# Patient Record
Sex: Male | Born: 2000
Health system: Southern US, Community
[De-identification: ages and names within clinical notes are randomized; demographics above are authoritative.]

## PROBLEM LIST (undated history)

## (undated) DIAGNOSIS — D649 Anemia, unspecified: Secondary | ICD-10-CM

## (undated) DIAGNOSIS — J309 Allergic rhinitis, unspecified: Secondary | ICD-10-CM

## (undated) DIAGNOSIS — R112 Nausea with vomiting, unspecified: Secondary | ICD-10-CM

## (undated) DIAGNOSIS — Z862 Personal history of diseases of the blood and blood-forming organs and certain disorders involving the immune mechanism: Secondary | ICD-10-CM

## (undated) DIAGNOSIS — Z9889 Other specified postprocedural states: Secondary | ICD-10-CM

## (undated) DIAGNOSIS — R42 Dizziness and giddiness: Secondary | ICD-10-CM

## (undated) DIAGNOSIS — E611 Iron deficiency: Secondary | ICD-10-CM

## (undated) HISTORY — DX: Dizziness and giddiness: R42

## (undated) HISTORY — DX: Allergic rhinitis, unspecified: J30.9

## (undated) HISTORY — PX: ESOPHAGOGASTRODUODENOSCOPY: SHX1529

## (undated) HISTORY — PX: INNER EAR SURGERY: SHX679

## (undated) HISTORY — DX: Anemia, unspecified: D64.9

## (undated) HISTORY — DX: Iron deficiency: E61.1

---
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
## 2022-03-12 ENCOUNTER — Telehealth: Payer: Self-pay | Admitting: Cardiology

## 2022-03-12 NOTE — Telephone Encounter (Signed)
Patient states he received a call to schedule an appointment with Dr. Radford Pax. I did not see a referral and he states his mother spoke with Dr. Radford Pax and it was approved for him to schedule.

## 2022-03-13 ENCOUNTER — Telehealth: Payer: Self-pay

## 2022-03-13 NOTE — Telephone Encounter (Signed)
-----   Message from Sueanne Margarita, MD sent at 03/13/2022  9:35 AM EST ----- Regarding: RE: Appointment for [name]? Afternoon is fine ----- Message ----- From: Joni Reining, RN Sent: 03/13/2022   9:11 AM EST To: Sueanne Margarita, MD; Tor Netters, RN Subject: RE: Appointment for [name]?               PCP contacted. Unfortunately, the only time he could come in on Tuesday 03/17/22 is the afternoon. Is there something else you want me to try?  Danae Chen ----- Message ----- From: Sueanne Margarita, MD Sent: 03/13/2022   8:49 AM EST To: Joni Reining, RN; Tor Netters, RN Subject: RE: Appointment for [name]?               So I'm Reader D on Tuesday next week so just put him in anytime in the morning and I can see him.  Also yes contact his PCP for referral  Thank you! ----- Message ----- From: Joni Reining, RN Sent: 03/13/2022   8:05 AM EST To: Sueanne Margarita, MD; Tor Netters, RN Subject: RE: Appointment for [name]?               Mina Marble it. Someone got the 1:40 pm appt on 03/18/22 before I did. Is there another time next week that works?  Also, do you need me to contact his PCP and have them put in a referral? Danae Chen ----- Message ----- From: Sueanne Margarita, MD Sent: 03/11/2022   8:46 PM EST To: Joni Reining, RN Subject: RE: Appointment for [name]?               Yes that works great ----- Message ----- From: Joni Reining, RN Sent: 03/11/2022   3:52 PM EST To: Sueanne Margarita, MD Subject: Appointment for [name]?                   Hi Dr. Radford Pax, A patient of  yours named Dace Denn (MRN [id number]) called in today to say that she spoke with you about having her son Taishaun seen next week before he goes back to classes at [employer]. If that is true it looks like you have an opening on 03/18/12 at 2:40 pm if you would like to put him there.  Danae Chen, RN

## 2022-03-13 NOTE — Telephone Encounter (Signed)
Called and left message for patient to call us back regarding scheduling.

## 2022-03-13 NOTE — Telephone Encounter (Signed)
-----   Message from Sueanne Margarita, MD sent at 03/11/2022  8:46 PM EST ----- Regarding: RE: Appointment for [name]? Yes that works great ----- Message ----- From: Joni Reining, RN Sent: 03/11/2022   3:52 PM EST To: Sueanne Margarita, MD Subject: Appointment for [name]?                   Hi Dr. Radford Pax, A patient of  yours named Ryan Knight (MRN [id number]) called in today to say that she spoke with you about having her son [name] seen next week before he goes back to classes at [employer]. If that is true it looks like you have an opening on 03/18/12 at 2:40 pm if you would like to put him there.  Danae Chen, RN

## 2022-03-13 NOTE — Telephone Encounter (Signed)
See above phone note.  

## 2022-03-13 NOTE — Telephone Encounter (Signed)
Patient scheduled 03/17/22 at 2 pm with Dr. Radford Pax for lightheadedness. Requesting referral. Please call [facility] at [phone] if any questions.

## 2022-03-13 NOTE — Telephone Encounter (Signed)
Scheduled patient for 03/17/22 at 2 pm, left message with this appointment information on patient's voice mail asking that patient call back and confirm.

## 2022-03-16 NOTE — Telephone Encounter (Signed)
Attempted phone call to pt as we do not have a DPR on file.  Left voicemail message to contact the office at [phone].

## 2022-03-16 NOTE — Telephone Encounter (Signed)
Patient's mother is following up requesting to speak with Dr. Theodosia Blender nurse regarding 1/09 appointment. She states she spoke with Dr. Radford Pax and she advised that it may not be necessary for the patient to be seen by her--he may just need a heart monitor. Patient's mother would like to confirm whether or not appointment is needed prior to cancelling. She would also like to know what type of monitor may be needed for the patient. Please advise.

## 2022-03-16 NOTE — Telephone Encounter (Signed)
Pt called back. He stated he tried to get a referral form PCP was was unsuccessful. My team lead, Edwena Blow tried to call Dr. Joylene Draft office however they turned their phones off at 4:00pm due to high call volume. Edwena Blow said she will try to c/b tomorrow morning for an urgent referral.

## 2022-03-17 ENCOUNTER — Ambulatory Visit: Payer: Self-pay | Admitting: Cardiology

## 2022-03-24 NOTE — H&P (Signed)
  Patient's anticipated LOS is less than 2 midnights, meeting these requirements: - Younger than 31 - Lives within 1 hour of care - Has a competent adult at home to recover with post-op recover - NO history of  - Chronic pain requiring opiods  - Diabetes  - Coronary Artery Disease  - Heart failure  - Heart attack  - Stroke  - DVT/VTE  - Cardiac arrhythmia  - Respiratory Failure/COPD  - Renal failure  - Anemia  - Advanced Liver disease     Ryan Knight is an 22 y.o. male.    Chief Complaint: right shoulder pain  HPI: Pt is a 22 y.o. male complaining of right shoulder pain since recent fall. Pain had continually increased since the beginning. X-rays in the clinic show displaced right clavicle fracture.Various options are discussed with the patient. Risks, benefits and expectations were discussed with the patient. Patient understand the risks, benefits and expectations and wishes to proceed with surgery.   PCP:  Crist Infante, MD  D/C Plans: Home  PMH: Past Medical History:  Diagnosis Date   Allergic rhinitis    Anemia     PSH: Past Surgical History:  Procedure Laterality Date   ESOPHAGOGASTRODUODENOSCOPY     INNER EAR SURGERY Right     Social History:  reports that he has never smoked. He has never used smokeless tobacco. He reports that he does not drink alcohol and does not use drugs. BMI: Estimated body mass index is 20.03 kg/m as calculated from the following:   Height as of 02/08/19: 6\' 2"  (1.88 m).   Weight as of 02/08/19: 70.8 kg.    Diabetes: Patient does not have a diagnosis of diabetes.     Smoking Status:   reports that he has never smoked. He has never used smokeless tobacco.    Allergies:  Allergies  Allergen Reactions   Penicillins     Medications: No current facility-administered medications for this encounter.   Current Outpatient Medications  Medication Sig Dispense Refill   MULTIPLE VITAMIN PO Take 1 tablet by mouth daily.       No results found for this or any previous visit (from the past 48 hour(s)). No results found.  ROS: Pain with rom of the right upper extremity  Physical Exam: Alert and oriented 22 y.o. male in no acute distress Cranial nerves 2-12 intact Cervical spine: full rom with no tenderness, nv intact distally Chest: active breath sounds bilaterally, no wheeze rhonchi or rales Heart: regular rate and rhythm, no murmur Abd: non tender non distended with active bowel sounds Hip is stable with rom  Right shoulder with obvious deformity of midshaft clavicle with tenting of the skin Nv intact distally No signs of open injury   Assessment/Plan Assessment: right clavicle fracture  Plan:  Patient will undergo a right clavicle ORIF by Dr. Veverly Fells at [place] Risks benefits and expectations were discussed with the patient. Patient understand risks, benefits and expectations and wishes to proceed. Preoperative templating of the joint replacement has been completed, documented, and submitted to the Operating Room personnel in order to optimize intra-operative equipment management.   Merla Riches PA-C, MPAS [facility] is now [employer] [address], [place], [place], [place] [address] Phone: [phone] [email or web address] Facebook  [employer]

## 2022-03-25 ENCOUNTER — Encounter (HOSPITAL_COMMUNITY): Payer: Self-pay | Admitting: Orthopedic Surgery

## 2022-03-25 ENCOUNTER — Other Ambulatory Visit: Payer: Self-pay

## 2022-03-25 NOTE — Progress Notes (Addendum)
For Anesthesia: PCP - Crist Infante, MD  Cardiologist - N/A  Chest x-ray - N/A EKG - Dr. Silvestre Mesi office Stress Test - N/A ECHO - N/A Cardiac Cath - N/A Pacemaker/ICD device last checked: N/A Pacemaker orders received: N/A Device Rep notified: N/A  Spinal Cord Stimulator: N/A  Sleep Study - N/A CPAP -   Fasting Blood Sugar - N/A Checks Blood Sugar __N/A___ times a day Date and result of last Hgb A1c-N/A  Last dose of GLP1 agonist- N/A GLP1 instructions: N/A  Last dose of SGLT-2 inhibitors- N/A SGLT-2 instructions: N/A  Blood Thinner Instructions: N/A Aspirin Instructions: N/A Last Dose: N/A  Activity level: Able to exercise without chest pain and/or shortness of breath     Anesthesia review: N/A  Patient denies shortness of breath, fever, cough and chest pain at PAT appointment   Patient verbalized understanding of instructions reviewed via telephone.

## 2022-03-25 NOTE — Progress Notes (Signed)
Attempted to obtain medical history via telephone, unable to reach at this time. HIPAA compliant voicemail message left requesting return call to pre surgical testing department.  

## 2022-03-27 ENCOUNTER — Ambulatory Visit (HOSPITAL_COMMUNITY): Payer: 59 | Admitting: Anesthesiology

## 2022-03-27 ENCOUNTER — Other Ambulatory Visit: Payer: Self-pay

## 2022-03-27 ENCOUNTER — Encounter (HOSPITAL_COMMUNITY): Payer: Self-pay | Admitting: Orthopedic Surgery

## 2022-03-27 ENCOUNTER — Encounter (HOSPITAL_COMMUNITY)
Admission: RE | Disposition: A | Discharge status: Home / Self Care | Payer: Self-pay | Source: Home / Self Care | Attending: Orthopedic Surgery

## 2022-03-27 ENCOUNTER — Ambulatory Visit (HOSPITAL_BASED_OUTPATIENT_CLINIC_OR_DEPARTMENT_OTHER): Payer: 59 | Admitting: Anesthesiology

## 2022-03-27 ENCOUNTER — Ambulatory Visit (HOSPITAL_COMMUNITY)
Admission: RE | Admit: 2022-03-27 | Discharge: 2022-03-27 | Disposition: A | Discharge status: Home / Self Care | Payer: 59 | Attending: Orthopedic Surgery | Admitting: Orthopedic Surgery

## 2022-03-27 ENCOUNTER — Ambulatory Visit (HOSPITAL_COMMUNITY): Payer: 59

## 2022-03-27 DIAGNOSIS — D649 Anemia, unspecified: Secondary | ICD-10-CM

## 2022-03-27 DIAGNOSIS — Y9323 Activity, snow (alpine) (downhill) skiing, snow boarding, sledding, tobogganing and snow tubing: Secondary | ICD-10-CM | POA: Diagnosis not present

## 2022-03-27 DIAGNOSIS — S42021A Displaced fracture of shaft of right clavicle, initial encounter for closed fracture: Secondary | ICD-10-CM

## 2022-03-27 HISTORY — DX: Personal history of diseases of the blood and blood-forming organs and certain disorders involving the immune mechanism: Z86.2

## 2022-03-27 HISTORY — DX: Nausea with vomiting, unspecified: R11.2

## 2022-03-27 HISTORY — DX: Other specified postprocedural states: Z98.890

## 2022-03-27 HISTORY — PX: ORIF CLAVICULAR FRACTURE: SHX5055

## 2022-03-27 LAB — CBC
HCT: 46 % (ref 39.0–52.0)
Hemoglobin: 14.7 g/dL (ref 13.0–17.0)
MCH: 25 pg — ABNORMAL LOW (ref 26.0–34.0)
MCHC: 32 g/dL (ref 30.0–36.0)
MCV: 78.2 fL — ABNORMAL LOW (ref 80.0–100.0)
Platelets: 243 10*3/uL (ref 150–400)
RBC: 5.88 MIL/uL — ABNORMAL HIGH (ref 4.22–5.81)
RDW: 13.7 % (ref 11.5–15.5)
WBC: 5.5 10*3/uL (ref 4.0–10.5)
nRBC: 0 % (ref 0.0–0.2)

## 2022-03-27 SURGERY — OPEN REDUCTION INTERNAL FIXATION (ORIF) CLAVICULAR FRACTURE
Anesthesia: General | Laterality: Right

## 2022-03-27 MED ORDER — ACETAMINOPHEN 500 MG PO TABS
1000.0000 mg | ORAL_TABLET | Freq: Once | ORAL | Status: AC
  Administered 2022-03-27: 1000 mg via ORAL
  Filled 2022-03-27: qty 2

## 2022-03-27 MED ORDER — PROPOFOL 10 MG/ML IV BOLUS
INTRAVENOUS | Status: DC | PRN
  Administered 2022-03-27: 200 mg via INTRAVENOUS

## 2022-03-27 MED ORDER — BUPIVACAINE-EPINEPHRINE 0.25% -1:200000 IJ SOLN
INTRAMUSCULAR | Status: DC | PRN
  Administered 2022-03-27: 30 mL

## 2022-03-27 MED ORDER — FENTANYL CITRATE (PF) 250 MCG/5ML IJ SOLN
INTRAMUSCULAR | Status: AC
  Filled 2022-03-27: qty 5

## 2022-03-27 MED ORDER — SUGAMMADEX SODIUM 200 MG/2ML IV SOLN
INTRAVENOUS | Status: DC | PRN
  Administered 2022-03-27: 300 mg via INTRAVENOUS

## 2022-03-27 MED ORDER — PROPOFOL 10 MG/ML IV BOLUS
INTRAVENOUS | Status: AC
  Filled 2022-03-27: qty 20

## 2022-03-27 MED ORDER — LIDOCAINE HCL (CARDIAC) PF 100 MG/5ML IV SOSY
PREFILLED_SYRINGE | INTRAVENOUS | Status: DC | PRN
  Administered 2022-03-27: 80 mg via INTRATRACHEAL

## 2022-03-27 MED ORDER — PHENYLEPHRINE HCL-NACL 20-0.9 MG/250ML-% IV SOLN
INTRAVENOUS | Status: DC | PRN
  Administered 2022-03-27: 25 ug/min via INTRAVENOUS

## 2022-03-27 MED ORDER — CHLORHEXIDINE GLUCONATE 0.12 % MT SOLN
15.0000 mL | Freq: Once | OROMUCOSAL | Status: AC
  Administered 2022-03-27: 15 mL via OROMUCOSAL

## 2022-03-27 MED ORDER — ROCURONIUM BROMIDE 100 MG/10ML IV SOLN
INTRAVENOUS | Status: DC | PRN
  Administered 2022-03-27: 10 mg via INTRAVENOUS
  Administered 2022-03-27: 50 mg via INTRAVENOUS

## 2022-03-27 MED ORDER — ORAL CARE MOUTH RINSE: 15.0000 mL | Freq: Once | OROMUCOSAL | Status: AC

## 2022-03-27 MED ORDER — FENTANYL CITRATE PF 50 MCG/ML IJ SOSY
PREFILLED_SYRINGE | INTRAMUSCULAR | Status: AC
  Filled 2022-03-27: qty 2

## 2022-03-27 MED ORDER — ONDANSETRON HCL 4 MG/2ML IJ SOLN
INTRAMUSCULAR | Status: DC | PRN
  Administered 2022-03-27: 4 mg via INTRAVENOUS

## 2022-03-27 MED ORDER — OXYCODONE HCL 5 MG PO TABS: 5.0000 mg | ORAL_TABLET | Freq: Once | ORAL | Status: DC | PRN

## 2022-03-27 MED ORDER — METHOCARBAMOL 500 MG PO TABS: 500.0000 mg | ORAL_TABLET | Freq: Three times a day (TID) | ORAL | 1 refills | Status: AC | PRN

## 2022-03-27 MED ORDER — LACTATED RINGERS IV SOLN: INTRAVENOUS | Status: DC

## 2022-03-27 MED ORDER — DEXAMETHASONE SODIUM PHOSPHATE 10 MG/ML IJ SOLN
INTRAMUSCULAR | Status: DC | PRN
  Administered 2022-03-27: 10 mg via INTRAVENOUS

## 2022-03-27 MED ORDER — KETOROLAC TROMETHAMINE 30 MG/ML IJ SOLN
INTRAMUSCULAR | Status: DC | PRN
  Administered 2022-03-27: 30 mg via INTRAVENOUS

## 2022-03-27 MED ORDER — OXYCODONE HCL 5 MG/5ML PO SOLN: 5.0000 mg | Freq: Once | ORAL | Status: DC | PRN

## 2022-03-27 MED ORDER — EPINEPHRINE PF 1 MG/ML IJ SOLN
INTRAMUSCULAR | Status: AC
  Filled 2022-03-27: qty 1

## 2022-03-27 MED ORDER — MIDAZOLAM HCL 2 MG/2ML IJ SOLN
INTRAMUSCULAR | Status: AC
  Filled 2022-03-27: qty 2

## 2022-03-27 MED ORDER — ROCURONIUM BROMIDE 10 MG/ML (PF) SYRINGE
PREFILLED_SYRINGE | INTRAVENOUS | Status: AC
  Filled 2022-03-27: qty 10

## 2022-03-27 MED ORDER — CELECOXIB 200 MG PO CAPS
200.0000 mg | ORAL_CAPSULE | Freq: Once | ORAL | Status: AC
  Administered 2022-03-27: 200 mg via ORAL
  Filled 2022-03-27: qty 1

## 2022-03-27 MED ORDER — PROMETHAZINE HCL 25 MG/ML IJ SOLN: 6.2500 mg | INTRAMUSCULAR | Status: DC | PRN

## 2022-03-27 MED ORDER — ONDANSETRON HCL 4 MG PO TABS: 4.0000 mg | ORAL_TABLET | Freq: Three times a day (TID) | ORAL | 1 refills | Status: AC | PRN

## 2022-03-27 MED ORDER — EPHEDRINE 5 MG/ML INJ
INTRAVENOUS | Status: AC
  Filled 2022-03-27: qty 15

## 2022-03-27 MED ORDER — DEXAMETHASONE SODIUM PHOSPHATE 10 MG/ML IJ SOLN
INTRAMUSCULAR | Status: AC
  Filled 2022-03-27: qty 3

## 2022-03-27 MED ORDER — AMISULPRIDE (ANTIEMETIC) 5 MG/2ML IV SOLN: 10.0000 mg | Freq: Once | INTRAVENOUS | Status: DC | PRN

## 2022-03-27 MED ORDER — ONDANSETRON HCL 4 MG/2ML IJ SOLN
INTRAMUSCULAR | Status: AC
  Filled 2022-03-27: qty 6

## 2022-03-27 MED ORDER — MEPERIDINE HCL 50 MG/ML IJ SOLN: 6.2500 mg | INTRAMUSCULAR | Status: DC | PRN

## 2022-03-27 MED ORDER — HYDROCODONE-ACETAMINOPHEN 5-325 MG PO TABS: 1.0000 | ORAL_TABLET | Freq: Four times a day (QID) | ORAL | 0 refills | Status: AC | PRN

## 2022-03-27 MED ORDER — EPHEDRINE 5 MG/ML INJ
INTRAVENOUS | Status: AC
  Filled 2022-03-27: qty 5

## 2022-03-27 MED ORDER — LIDOCAINE HCL (PF) 2 % IJ SOLN
INTRAMUSCULAR | Status: AC
  Filled 2022-03-27: qty 30

## 2022-03-27 MED ORDER — SUCCINYLCHOLINE CHLORIDE 200 MG/10ML IV SOSY
PREFILLED_SYRINGE | INTRAVENOUS | Status: AC
  Filled 2022-03-27: qty 30

## 2022-03-27 MED ORDER — CEFAZOLIN SODIUM-DEXTROSE 2-4 GM/100ML-% IV SOLN
2.0000 g | INTRAVENOUS | Status: AC
  Administered 2022-03-27: 2 g via INTRAVENOUS
  Filled 2022-03-27: qty 100

## 2022-03-27 MED ORDER — BUPIVACAINE HCL (PF) 0.25 % IJ SOLN
INTRAMUSCULAR | Status: AC
  Filled 2022-03-27: qty 30

## 2022-03-27 MED ORDER — MIDAZOLAM HCL 2 MG/2ML IJ SOLN
INTRAMUSCULAR | Status: DC | PRN
  Administered 2022-03-27: 2 mg via INTRAVENOUS

## 2022-03-27 MED ORDER — FENTANYL CITRATE (PF) 250 MCG/5ML IJ SOLN
INTRAMUSCULAR | Status: DC | PRN
  Administered 2022-03-27: 100 ug via INTRAVENOUS
  Administered 2022-03-27: 50 ug via INTRAVENOUS
  Administered 2022-03-27 (×2): 100 ug via INTRAVENOUS

## 2022-03-27 MED ORDER — 0.9 % SODIUM CHLORIDE (POUR BTL) OPTIME
TOPICAL | Status: DC | PRN
  Administered 2022-03-27: 1000 mL

## 2022-03-27 MED ORDER — HYDROMORPHONE HCL 1 MG/ML IJ SOLN: 0.2500 mg | INTRAMUSCULAR | Status: DC | PRN

## 2022-03-27 MED ORDER — PROPOFOL 500 MG/50ML IV EMUL
INTRAVENOUS | Status: AC
  Filled 2022-03-27: qty 50

## 2022-03-27 SURGICAL SUPPLY — 47 items
BAG COUNTER SPONGE SURGICOUNT (BAG) IMPLANT
BAG ZIPLOCK 12X15 (MISCELLANEOUS) ×1 IMPLANT
BIT DRILL TWSTPONT CRW 3.2X180 (DRILL) IMPLANT
BOOTIES KNEE HIGH SLOAN (MISCELLANEOUS) ×2 IMPLANT
COVER SURGICAL LIGHT HANDLE (MISCELLANEOUS) ×1 IMPLANT
DRAPE ORTHO SPLIT 77X108 STRL (DRAPES) ×2
DRAPE POUCH INSTRU U-SHP 10X18 (DRAPES) ×1 IMPLANT
DRAPE SURG 17X11 SM STRL (DRAPES) ×1 IMPLANT
DRAPE SURG ORHT 6 SPLT 77X108 (DRAPES) ×2 IMPLANT
DRAPE U-SHAPE 47X51 STRL (DRAPES) ×1 IMPLANT
DRILL TWISTPOINT CROWE 3.2X180 (DRILL) ×1
DRSG EMULSION OIL 3X16 NADH (GAUZE/BANDAGES/DRESSINGS) IMPLANT
DRSG EMULSION OIL 3X3 NADH (GAUZE/BANDAGES/DRESSINGS) ×1 IMPLANT
DURAPREP 26ML APPLICATOR (WOUND CARE) ×1 IMPLANT
ELECT NDL TIP 2.8 STRL (NEEDLE) ×1 IMPLANT
ELECT NEEDLE TIP 2.8 STRL (NEEDLE) ×1 IMPLANT
ELECT REM PT RETURN 15FT ADLT (MISCELLANEOUS) ×1 IMPLANT
GAUZE PAD ABD 7.5X8 STRL (GAUZE/BANDAGES/DRESSINGS) IMPLANT
GAUZE PAD ABD 8X10 STRL (GAUZE/BANDAGES/DRESSINGS) ×1 IMPLANT
GAUZE SPONGE 4X4 12PLY STRL (GAUZE/BANDAGES/DRESSINGS) ×1 IMPLANT
GLOVE BIOGEL PI IND STRL 7.5 (GLOVE) ×1 IMPLANT
GLOVE BIOGEL PI IND STRL 8.5 (GLOVE) ×1 IMPLANT
GLOVE ORTHO TXT STRL SZ7.5 (GLOVE) ×1 IMPLANT
GLOVE SURG ORTHO 8.5 STRL (GLOVE) ×1 IMPLANT
GOWN STRL REUS W/ TWL XL LVL3 (GOWN DISPOSABLE) ×2 IMPLANT
GOWN STRL REUS W/TWL XL LVL3 (GOWN DISPOSABLE) ×2
KIT BASIN OR (CUSTOM PROCEDURE TRAY) ×1 IMPLANT
KIT TURNOVER KIT A (KITS) IMPLANT
MANIFOLD NEPTUNE II (INSTRUMENTS) ×1 IMPLANT
NDL SAFETY ECLIP 18X1.5 (MISCELLANEOUS) ×1 IMPLANT
NS IRRIG 1000ML POUR BTL (IV SOLUTION) ×1 IMPLANT
PACK SHOULDER (CUSTOM PROCEDURE TRAY) ×1 IMPLANT
PIN CLAVICLE ASSEMBLY 3.0MM (Pin) IMPLANT
PROTECTOR NERVE ULNAR (MISCELLANEOUS) ×1 IMPLANT
SLING ARM IMMOBILIZER LRG (SOFTGOODS) ×1 IMPLANT
SPIKE FLUID TRANSFER (MISCELLANEOUS) ×1 IMPLANT
SPONGE T-LAP 4X18 ~~LOC~~+RFID (SPONGE) IMPLANT
STRIP CLOSURE SKIN 1/2X4 (GAUZE/BANDAGES/DRESSINGS) ×1 IMPLANT
SUCTION FRAZIER HANDLE 12FR (TUBING) ×1
SUCTION TUBE FRAZIER 12FR DISP (TUBING) ×1 IMPLANT
SUT FIBERWIRE #2 38 T-5 BLUE (SUTURE)
SUT MNCRL AB 3-0 PS2 18 (SUTURE) ×1 IMPLANT
SUT VIC AB 0 CT1 36 (SUTURE) ×1 IMPLANT
SUT VIC AB 2-0 CT1 27 (SUTURE) ×1
SUT VIC AB 2-0 CT1 TAPERPNT 27 (SUTURE) ×1 IMPLANT
SUTURE FIBERWR #2 38 T-5 BLUE (SUTURE) IMPLANT
TOWEL OR 17X26 10 PK STRL BLUE (TOWEL DISPOSABLE) ×1 IMPLANT

## 2022-03-27 NOTE — Brief Op Note (Signed)
03/27/2022  5:30 PM  PATIENT:  Ryan Knight  22 y.o. male  PRE-OPERATIVE DIAGNOSIS:  Fracture of clavicle, displaced midshaft  POST-OPERATIVE DIAGNOSIS:  Fracture of clavicle, displaced midshaft  PROCEDURE:  Procedure(s): OPEN REDUCTION INTERNAL FIXATION (ORIF) CLAVICULAR FRACTURE (Right) Biomet IM nail  SURGEON:  Surgeon(s) and Role:    Netta Cedars, MD - Primary  PHYSICIAN ASSISTANT:   ASSISTANTS: Ventura Bruns, PA-C   ANESTHESIA:   local and general  EBL:  minimal  BLOOD ADMINISTERED:none  DRAINS: none   LOCAL MEDICATIONS USED:  MARCAINE     SPECIMEN:  No Specimen  DISPOSITION OF SPECIMEN:  N/A  COUNTS:  YES  TOURNIQUET:  * No tourniquets in log *  DICTATION: .Other Dictation: Dictation Number [id number]  PLAN OF CARE: Discharge to home after PACU  PATIENT DISPOSITION:  PACU - hemodynamically stable.   Delay start of Pharmacological VTE agent (>24hrs) due to surgical blood loss or risk of bleeding: yes

## 2022-03-27 NOTE — Anesthesia Postprocedure Evaluation (Signed)
Anesthesia Post Note  Patient: Ryan Knight  Procedure(s) Performed: OPEN REDUCTION INTERNAL FIXATION (ORIF) CLAVICULAR FRACTURE (Right)     Patient location during evaluation: PACU Anesthesia Type: General Level of consciousness: sedated and patient cooperative Pain management: pain level controlled Vital Signs Assessment: post-procedure vital signs reviewed and stable Respiratory status: spontaneous breathing Cardiovascular status: stable Anesthetic complications: no   No notable events documented.  Last Vitals:  Vitals:   03/27/22 1737 03/27/22 1815  BP: 139/75 130/80  Pulse: 76 70  Resp: 16 14  Temp: 37.7 C 37.2 C  SpO2: 100% 99%    Last Pain:  Vitals:   03/27/22 1815  TempSrc:   PainSc: 0-No pain                 Nolon Nations

## 2022-03-27 NOTE — Anesthesia Procedure Notes (Signed)
Procedure Name: Intubation Date/Time: 03/27/2022 4:18 PM  Performed by: Cynda Familia, CRNAPre-anesthesia Checklist: Patient identified, Emergency Drugs available, Suction available and Patient being monitored Patient Re-evaluated:Patient Re-evaluated prior to induction Oxygen Delivery Method: Circle System Utilized Preoxygenation: Pre-oxygenation with 100% oxygen Induction Type: IV induction and Cricoid Pressure applied Ventilation: Mask ventilation without difficulty Laryngoscope Size: Miller and 2 Grade View: Grade I Tube type: Oral Number of attempts: 1 Airway Equipment and Method: Stylet Placement Confirmation: ETT inserted through vocal cords under direct vision, positive ETCO2 and breath sounds checked- equal and bilateral Secured at: 23 cm Tube secured with: Tape Dental Injury: Teeth and Oropharynx as per pre-operative assessment  Comments: IV induction [place]--intubation AM CRNA atraumatic-- teeth and mouth as preop bilat BS

## 2022-03-27 NOTE — Anesthesia Preprocedure Evaluation (Addendum)
Anesthesia Evaluation  Patient identified by MRN, date of birth, ID band Patient awake    Reviewed: Allergy & Precautions, NPO status , Patient's Chart, lab work & pertinent test results  History of Anesthesia Complications (+) PONV and history of anesthetic complications  Airway Mallampati: II  TM Distance: >3 FB Neck ROM: Full    Dental no notable dental hx.    Pulmonary neg pulmonary ROS   Pulmonary exam normal breath sounds clear to auscultation       Cardiovascular negative cardio ROS Normal cardiovascular exam Rhythm:Regular Rate:Normal     Neuro/Psych negative neurological ROS     GI/Hepatic negative GI ROS, Neg liver ROS,,,  Endo/Other  negative endocrine ROS    Renal/GU negative Renal ROS     Musculoskeletal negative musculoskeletal ROS (+)    Abdominal   Peds  Hematology negative hematology ROS (+)   Anesthesia Other Findings   Reproductive/Obstetrics                             Anesthesia Physical Anesthesia Plan  ASA: 1  Anesthesia Plan: General   Post-op Pain Management: Tylenol PO (pre-op)* and Celebrex PO (pre-op)*   Induction: Intravenous  PONV Risk Score and Plan: 4 or greater and Ondansetron, Dexamethasone, Treatment may vary due to age or medical condition and Midazolam  Airway Management Planned: Oral ETT  Additional Equipment: None  Intra-op Plan:   Post-operative Plan: Extubation in OR  Informed Consent: I have reviewed the patients History and Physical, chart, labs and discussed the procedure including the risks, benefits and alternatives for the proposed anesthesia with the patient or authorized representative who has indicated his/her understanding and acceptance.     Dental advisory given  Plan Discussed with: CRNA  Anesthesia Plan Comments: (Discussed preop block with patient. He feels like he will be fine without block. I offered potential  post op block if needed. All questions answered.)        Anesthesia Quick Evaluation

## 2022-03-27 NOTE — Transfer of Care (Signed)
Immediate Anesthesia Transfer of Care Note  Patient: Ryan Knight  Procedure(s) Performed: OPEN REDUCTION INTERNAL FIXATION (ORIF) CLAVICULAR FRACTURE (Right)  Patient Location: PACU  Anesthesia Type:General  Level of Consciousness: awake  Airway & Oxygen Therapy: Patient Spontanous Breathing and Patient connected to face mask oxygen  Post-op Assessment: Report given to RN and Post -op Vital signs reviewed and stable  Post vital signs: Reviewed and stable  Last Vitals:  Vitals Value Taken Time  BP 139/75 03/27/22 1737  Temp 37.7 C 03/27/22 1737  Pulse 78 03/27/22 1739  Resp 10 03/27/22 1739  SpO2 100 % 03/27/22 1739  Vitals shown include unvalidated device data.  Last Pain:  Vitals:   03/27/22 1335  TempSrc:   PainSc: 2       Patients Stated Pain Goal: 4 (29/56/21 3086)  Complications: No notable events documented.

## 2022-03-27 NOTE — Interval H&P Note (Signed)
History and Physical Interval Note:  03/27/2022 2:16 PM  Ryan Knight  has presented today for surgery, with the diagnosis of Fracture of clavicle.  The various methods of treatment have been discussed with the patient and family. After consideration of risks, benefits and other options for treatment, the patient has consented to  Procedure(s): OPEN REDUCTION INTERNAL FIXATION (ORIF) CLAVICULAR FRACTURE (Right) as a surgical intervention.  The patient's history has been reviewed, patient examined, no change in status, stable for surgery.  I have reviewed the patient's chart and labs.  Questions were answered to the patient's satisfaction.     Augustin Schooling

## 2022-03-27 NOTE — Op Note (Signed)
NAME: Neth, Rice RECORD NO: [id number] ACCOUNT NO: [id number] DATE OF BIRTH: Feb 28, 2001 FACILITY: [facility] LOCATION: [facility] PHYSICIAN: Doran Heater. Veverly Fells, MD  Operative Report   DATE OF PROCEDURE: 03/27/2022  PREOPERATIVE DIAGNOSIS:  Right displaced midshaft clavicle fracture.  POSTOPERATIVE DIAGNOSIS:  Right displaced midshaft clavicle fracture.  PROCEDURE PERFORMED:  ORIF right displaced midshaft clavicle fracture utilizing intramedullary nail by Biomet.  ATTENDING SURGEON:  Doran Heater. Veverly Fells, MD  ASSISTANT:  Charletta Cousin [place], [place], who was scrubbed during the entire procedure, and necessary for satisfactory completion of surgery.  ANESTHESIA:  General anesthesia was used plus local.  ESTIMATED BLOOD LOSS:  Minimal.  FLUID REPLACEMENT:  1000 mL crystalloid.  COUNTS:  Instrument counts correct.  COMPLICATIONS:  No complications.  ANTIBIOTICS:  Perioperative antibiotics were given.  INDICATIONS:  The patient is a 22 year old male who suffered a fall while snowboarding injuring his right clavicle.  The patient presented with a displaced dorsally angulated or apex dorsal midshaft clavicle fracture.  Due to extreme angulation and  impending erosion of the bone through the skin, we discussed options and recommended ORIF with an intramedullary nail, given the fairly straightforward fracture pattern midshaft.  The patient agreed with this plan and his mother.  Informed consent  obtained.  DESCRIPTION OF PROCEDURE:  After an adequate level of anesthesia was achieved, the patient was positioned in modified beach chair position.  Right shoulder correctly identified and sterilely prepped and draped in the usual manner.  Timeout called,  verifying correct patient, correct site.  C-arm draped into the field.  We used a small Langer's line skin incision overlying the fracture site midshaft clavicle with a 10 blade, dissection down through subcutaneous tissues.  We identified the  platysma  and trapezius and divided that in line with the clavicle.  We identified the fractured clavicle end, we drilled out the medial fracture fragment.  This was not quite transverse but more an oblique fracture pattern, but still amenable to intramedullary  pinning.  We mobilized the medial fragment.  We drilled with a 3.2 drill bit into the intramedullary canal of the medial fragment.  We then tapped with a 3.0 clavicle tap for the clavicle pin.  We then identified the lateral fragment, freed that up from  surrounding soft tissue.  We drilled the lateral side out the posterior cortex and then tapped laterally.  Next, we advanced the 3.0 clavicle pin, machined threads and first out the lateral clavicle fragment retrieved through a second stab incision  posteriorly.  We then placed a medial clavicle pin nut and a lateral clavicle pin nut and then cold welded those at the appropriate length for the clavicle pin assembly. We cut away the remaining clavicle pin.  We used the knurled end of a Senn retractor  and smoothed down the cut edge of the pin, so that would not irritate the skin.  We then under direct visualization, after we irrigated the fracture site, we aligned the fracture and sent the pin across the fracture intramedullary, gaining anatomic  alignment of the fracture.  Once we irrigated the fracture site, made sure the pin was in proper position on all views with the C-arm.  We irrigated and then closed the muscular layer with 0 Vicryl suture followed by 2-0 Vicryl for subcutaneous closure  and 4-0 Monocryl for skin.  We utilized Marcaine in the muscular area for postoperative pain control.  The patient tolerated the procedure well, was placed in a shoulder sling, taken to recovery room  in stable condition.   SHW D: 03/27/2022 5:34:56 pm T: 03/27/2022 9:13:00 pm  JOB: [id number]/ [id number]

## 2022-03-27 NOTE — Discharge Instructions (Signed)
Ice to the shoulder constantly.  Keep the incision covered and clean and dry for one week, then ok to get it wet in the shower.  DO NOT reach behind your back or push up out of a chair with the operative arm. No push, pull or lift with the arm.   Use a sling while you are up and around for comfort, may remove while seated to do computer work.  Keep pillow propped behind the operative elbow.  Sleep semi-upright in a recliner for example.   Follow up with Dr Veverly Fells in two weeks in the office, call [phone] for appt

## 2022-03-30 ENCOUNTER — Encounter (HOSPITAL_COMMUNITY): Payer: Self-pay | Admitting: Orthopedic Surgery

## 2022-04-24 ENCOUNTER — Ambulatory Visit: Payer: 59 | Attending: Internal Medicine | Admitting: Internal Medicine

## 2022-05-01 ENCOUNTER — Inpatient Hospital Stay: Payer: 59

## 2022-05-01 ENCOUNTER — Inpatient Hospital Stay: Payer: 59 | Admitting: Oncology

## 2022-07-23 NOTE — Progress Notes (Deleted)
[facility] Cancer Initial Visit:  Patient Care Team: Rodrigo Ran, MD as PCP - General (Internal Medicine)  CHIEF COMPLAINTS/PURPOSE OF CONSULTATION:    HISTORY OF PRESENTING ILLNESS: Ryan Knight 22 y.o. male is here because of anemia Medical history notable for allergic rhinitis, erectile dysfunction,  Blood work from April 25, 2018 was remarkable for an MCV of 76.6. His hemoglobin was normal at 14.5. CBC was otherwise unremarkable. Normal comprehensive metabolic panel including liver tests. Additional blood work was ordered and obtained April 26, 2018. He was found to be iron deficient with a ferritin level of 10.3. Iron saturation lower end of normal at 20%. Vitamin D level low at 26.3. He underwent a gluten sensitivity screen which was negative. However antigliadin IgG antibody was positive at 35. Patient is completely asymptomatic.   September 19, 2018: EGD showed normal esophagus stomach and examined duodenum   Review of Systems - Oncology  MEDICAL HISTORY: Past Medical History:  Diagnosis Date   Allergic rhinitis    History of iron deficiency anemia    Iron deficiency    Lightheadedness    PONV (postoperative nausea and vomiting)     SURGICAL HISTORY: Past Surgical History:  Procedure Laterality Date   ESOPHAGOGASTRODUODENOSCOPY     INNER EAR SURGERY Right    ORIF CLAVICULAR FRACTURE Right 03/27/2022   Procedure: OPEN REDUCTION INTERNAL FIXATION (ORIF) CLAVICULAR FRACTURE;  Surgeon: Beverely Low, MD;  Location: [facility];  Service: Orthopedics;  Laterality: Right;    SOCIAL HISTORY: Social History   Socioeconomic History   Marital status: Significant Other    Spouse name: Not on file   Number of children: 0   Years of education: Not on file   Highest education level: Not on file  Occupational History   Occupation: student  Tobacco Use   Smoking status: Never   Smokeless tobacco: Never  Vaping Use   Vaping Use: Never used  Substance and  Sexual Activity   Alcohol use: Yes    Comment: beer   Drug use: Never   Sexual activity: Not on file  Other Topics Concern   Not on file  Social History Narrative   Not on file   Social Determinants of Health   Financial Resource Strain: Not on file  Food Insecurity: Not on file  Transportation Needs: Not on file  Physical Activity: Not on file  Stress: Not on file  Social Connections: Not on file  Intimate Partner Violence: Not on file    FAMILY HISTORY Family History  Problem Relation Age of Onset   Prostate cancer Father    Ovarian cancer Maternal Grandmother 60   Breast cancer Paternal Grandmother    Prostate cancer Paternal Grandfather    Melanoma Maternal Uncle     ALLERGIES:  is allergic to penicillins.  MEDICATIONS:  Current Outpatient Medications  Medication Sig Dispense Refill   HYDROcodone-acetaminophen (NORCO) 5-325 MG tablet Take 1-2 tablets by mouth every 6 (six) hours as needed for moderate pain or severe pain. 30 tablet 0   methocarbamol (ROBAXIN) 500 MG tablet Take 1 tablet (500 mg total) by mouth every 8 (eight) hours as needed for muscle spasms. 40 tablet 1   MULTIPLE VITAMIN PO Take 1 tablet by mouth daily.     ondansetron (ZOFRAN) 4 MG tablet Take 1 tablet (4 mg total) by mouth every 8 (eight) hours as needed for refractory nausea / vomiting, vomiting or nausea. 30 tablet 1   No current facility-administered medications for this  visit.    PHYSICAL EXAMINATION:  ECOG PERFORMANCE STATUS: {CHL ONC ECOG [phone]}   There were no vitals filed for this visit.  There were no vitals filed for this visit.   Physical Exam   LABORATORY DATA: I have personally reviewed the data as listed:  No visits with results within 1 Month(s) from this visit.  Latest known visit with results is:  Admission on 03/27/2022, Discharged on 03/27/2022  Component Date Value Ref Range Status   WBC 03/27/2022 5.5  4.0 - 10.5 K/uL Final   RBC 03/27/2022 5.88  (H)  4.22 - 5.81 MIL/uL Final   Hemoglobin 03/27/2022 14.7  13.0 - 17.0 g/dL Final   HCT 82/95/6213 46.0  39.0 - 52.0 % Final   MCV 03/27/2022 78.2 (L)  80.0 - 100.0 fL Final   MCH 03/27/2022 25.0 (L)  26.0 - 34.0 pg Final   MCHC 03/27/2022 32.0  30.0 - 36.0 g/dL Final   RDW 08/65/7846 13.7  11.5 - 15.5 % Final   Platelets 03/27/2022 243  150 - 400 K/uL Final   nRBC 03/27/2022 0.0  0.0 - 0.2 % Final   Performed at [facility], [address] [place], [place] [address]    RADIOGRAPHIC STUDIES: I have personally reviewed the radiological images as listed and agree with the findings in the report  No results found.  ASSESSMENT/PLAN Cancer Staging  No matching staging information was found for the patient.   No problem-specific Assessment & Plan notes found for this encounter.    No orders of the defined types were placed in this encounter.     minutes was spent in patient care.  This included time spent preparing to see the patient (e.g., review of tests), obtaining and/or reviewing separately obtained history, counseling and educating the patient/family/caregiver, ordering medications, tests, or procedures; documenting clinical information in the electronic or other health record, independently interpreting results and communicating results to the patient/family/caregiver as well as coordination of care.       All questions were answered. The patient knows to call the clinic with any problems, questions or concerns.  This note was electronically signed.    Loni Muse, MD  07/23/2022 4:47 PM

## 2022-07-24 ENCOUNTER — Inpatient Hospital Stay: Payer: 59 | Admitting: Oncology

## 2022-07-24 ENCOUNTER — Inpatient Hospital Stay: Payer: 59
